# Patient Record
Sex: Female | Born: 1980 | Race: White | Hispanic: No | Marital: Married | Smoking: Never smoker
Health system: Southern US, Community
[De-identification: ages and names within clinical notes are randomized; demographics above are authoritative.]

---

## 2007-12-05 ENCOUNTER — Other Ambulatory Visit: Admission: RE | Admit: 2007-12-05 | Discharge: 2007-12-05 | Payer: Self-pay | Admitting: Gynecology

## 2013-04-25 LAB — OB RESULTS CONSOLE ANTIBODY SCREEN: Antibody Screen: NEGATIVE

## 2013-04-25 LAB — OB RESULTS CONSOLE RPR: RPR: NONREACTIVE

## 2013-04-25 LAB — OB RESULTS CONSOLE HIV ANTIBODY (ROUTINE TESTING): HIV: NONREACTIVE

## 2013-04-25 LAB — OB RESULTS CONSOLE HEPATITIS B SURFACE ANTIGEN: Hepatitis B Surface Ag: NEGATIVE

## 2013-04-25 LAB — OB RESULTS CONSOLE RUBELLA ANTIBODY, IGM: Rubella: IMMUNE

## 2013-04-25 LAB — OB RESULTS CONSOLE ABO/RH: RH TYPE: POSITIVE

## 2013-11-07 LAB — OB RESULTS CONSOLE GBS: STREP GROUP B AG: NEGATIVE

## 2013-11-14 ENCOUNTER — Encounter (HOSPITAL_COMMUNITY): Payer: Self-pay | Admitting: Pharmacist

## 2013-11-23 ENCOUNTER — Encounter (HOSPITAL_COMMUNITY): Payer: Self-pay

## 2013-11-23 NOTE — H&P (Signed)
Erica Taylor is a 33 y.o. female presenting for primary cesarean section for breech presentation.  Declined external cephalic version. Prenatal course otherwise uncomplicated Maternal Medical History:  Reason for admission: Primary cesarean section  Fetal activity: Perceived fetal activity is normal.    Prenatal complications: Persistent breech presentation  Prenatal Complications - Diabetes: none.    OB History   Grav Para Term Preterm Abortions TAB SAB Ect Mult Living   1              No past medical history on file. No past surgical history on file. Family History: family history is not on file. Social History:  has no tobacco, alcohol, and drug history on file.   Prenatal Transfer Tool  Maternal Diabetes: No Genetic Screening: Declined Maternal Ultrasounds/Referrals: Normal Fetal Ultrasounds or other Referrals:  None Maternal Substance Abuse:  No Significant Maternal Medications:  None Significant Maternal Lab Results:  None Other Comments:  None  ROS    There were no vitals taken for this visit. Maternal Exam:  Abdomen: Fetal presentation: breech     Physical Exam  Constitutional: She is oriented to person, place, and time. She appears well-developed and well-nourished.  HENT:  Head: Normocephalic.  Mouth/Throat: Oropharynx is clear and moist.  Eyes: Conjunctivae and EOM are normal. Pupils are equal, round, and reactive to light.  Cardiovascular: Normal rate, regular rhythm and normal heart sounds.   Respiratory: Effort normal and breath sounds normal.  GI: Soft. Bowel sounds are normal.  Gravid uterus consistent with dates.   Genitourinary:  Cervix not checked  Musculoskeletal: Normal range of motion.  Neurological: She is alert and oriented to person, place, and time.    Prenatal labs: ABO, Rh: A/Positive/-- (07/15 1014) Antibody: Negative (07/15 1014) Rubella: Immune (07/15 1014) RPR: Nonreactive (07/15 1014)  HBsAg: Negative (07/15 1014)   HIV: Non-reactive (07/15 1014)  GBS:     Assessment/Plan: Intrauterine pregnancy at 39 weeks with breech presentation desires primary cesarean section Risk of cesarean section discussed.  These include:  Risk of infection;  Risk of hemorrhage that could require transfusions with the associated risk of aids or hepatitis;  Excessive bleeding could require hysterectomy;  Risk of injury to adjacent organs including bladder, bowel or ureters;  Risk of DVT's and possible pulmonary embolus.  Patient expresses a understanding of indications and risks.;Risk of cesarean section discussed.  These include:  Risk of infection;  Risk of hemorrhage that could require transfusions with the associated risk of aids or hepatitis;  Excessive bleeding could require hysterectomy;  Risk of injury to adjacent organs including bladder, bowel or ureters;  Risk of DVT's and possible pulmonary embolus.  Patient expresses a understanding of indications and risks.;  Larraine Argo S 11/23/2013, 4:57 PM

## 2013-11-24 NOTE — Patient Instructions (Signed)
Your procedure is scheduled on: Wednesday, Feb. 18, 2015  [facility] at: 7:00am  Pick up the phone at the desk and dial [phone].  Call this number if you have problems the morning of surgery: [phone].  Remember: Do NOT eat food: AFTER MIDNIGHT TUESDAY Do NOT drink clear liquids after: AFTER MIDNIGHT TUESDAY Take these medicines the morning of surgery with a SIP OF WATER: NONE  Do NOT wear jewelry (body piercing), make-up, or nail polish. Do NOT wear lotions, powders, or perfumes.  You may wear deoderant. Do NOT shave for 48 hours prior to surgery. Do NOT bring valuables to the hospital. Contacts, dentures, or bridgework may not be worn into surgery. Leave suitcase in car.  After surgery it may be brought to your room.  For patients admitted to the hospital, checkout time is 11:00 AM the day of discharge.

## 2013-11-27 ENCOUNTER — Encounter (HOSPITAL_COMMUNITY)
Admission: AD | Disposition: A | Discharge status: Home / Self Care | Payer: Self-pay | Source: Ambulatory Visit | Attending: Obstetrics & Gynecology

## 2013-11-27 ENCOUNTER — Encounter (HOSPITAL_COMMUNITY): Payer: Self-pay

## 2013-11-27 ENCOUNTER — Inpatient Hospital Stay (HOSPITAL_COMMUNITY)
Admission: AD | Admit: 2013-11-27 | Discharge: 2013-11-30 | DRG: 766 | Disposition: A | Discharge status: Home / Self Care | Payer: BC Managed Care – PPO | Source: Ambulatory Visit | Attending: Obstetrics & Gynecology | Admitting: Obstetrics & Gynecology

## 2013-11-27 ENCOUNTER — Inpatient Hospital Stay (HOSPITAL_COMMUNITY)
Admission: RE | Admit: 2013-11-27 | Discharge: 2013-11-27 | Disposition: A | Discharge status: Home / Self Care | Payer: BC Managed Care – PPO | Source: Ambulatory Visit

## 2013-11-27 ENCOUNTER — Encounter (HOSPITAL_COMMUNITY): Payer: BC Managed Care – PPO | Admitting: Anesthesiology

## 2013-11-27 ENCOUNTER — Inpatient Hospital Stay (HOSPITAL_COMMUNITY): Payer: BC Managed Care – PPO | Admitting: Anesthesiology

## 2013-11-27 DIAGNOSIS — D649 Anemia, unspecified: Secondary | ICD-10-CM | POA: Diagnosis present

## 2013-11-27 DIAGNOSIS — O321XX0 Maternal care for breech presentation, not applicable or unspecified: Secondary | ICD-10-CM | POA: Diagnosis present

## 2013-11-27 DIAGNOSIS — O9902 Anemia complicating childbirth: Secondary | ICD-10-CM | POA: Diagnosis present

## 2013-11-27 DIAGNOSIS — O4290 Premature rupture of membranes, unspecified as to length of time between rupture and onset of labor, unspecified weeks of gestation: Principal | ICD-10-CM | POA: Diagnosis present

## 2013-11-27 DIAGNOSIS — Z98891 History of uterine scar from previous surgery: Secondary | ICD-10-CM

## 2013-11-27 LAB — CBC
HCT: 32.6 % — ABNORMAL LOW (ref 36.0–46.0)
Hemoglobin: 10.9 g/dL — ABNORMAL LOW (ref 12.0–15.0)
MCH: 29.1 pg (ref 26.0–34.0)
MCHC: 33.4 g/dL (ref 30.0–36.0)
MCV: 86.9 fL (ref 78.0–100.0)
Platelets: 262 10*3/uL (ref 150–400)
RBC: 3.75 MIL/uL — ABNORMAL LOW (ref 3.87–5.11)
RDW: 13 % (ref 11.5–15.5)
WBC: 11.4 10*3/uL — AB (ref 4.0–10.5)

## 2013-11-27 LAB — TYPE AND SCREEN
ABO/RH(D): A POS
Antibody Screen: NEGATIVE

## 2013-11-27 LAB — ABO/RH: ABO/RH(D): A POS

## 2013-11-27 LAB — RPR: RPR Ser Ql: NONREACTIVE

## 2013-11-27 SURGERY — Surgical Case
Anesthesia: Spinal | Site: Abdomen

## 2013-11-27 MED ORDER — SODIUM CHLORIDE 0.9 % IJ SOLN
3.0000 mL | INTRAMUSCULAR | Status: DC | PRN
Start: 2013-11-27 — End: 2013-11-30

## 2013-11-27 MED ORDER — FAMOTIDINE IN NACL 20-0.9 MG/50ML-% IV SOLN
20.0000 mg | Freq: Once | INTRAVENOUS | Status: AC
  Administered 2013-11-27: 20 mg via INTRAVENOUS
  Filled 2013-11-27: qty 50

## 2013-11-27 MED ORDER — NALOXONE HCL 1 MG/ML IJ SOLN
1.0000 ug/kg/h | INTRAVENOUS | Status: DC | PRN
  Filled 2013-11-27: qty 2

## 2013-11-27 MED ORDER — ONDANSETRON HCL 4 MG/2ML IJ SOLN: 4.0000 mg | Freq: Three times a day (TID) | INTRAMUSCULAR | Status: DC | PRN

## 2013-11-27 MED ORDER — DIBUCAINE 1 % RE OINT: 1.0000 "application " | TOPICAL_OINTMENT | RECTAL | Status: DC | PRN

## 2013-11-27 MED ORDER — NALBUPHINE HCL 10 MG/ML IJ SOLN: 5.0000 mg | INTRAMUSCULAR | Status: DC | PRN

## 2013-11-27 MED ORDER — WITCH HAZEL-GLYCERIN EX PADS: 1.0000 "application " | MEDICATED_PAD | CUTANEOUS | Status: DC | PRN

## 2013-11-27 MED ORDER — FENTANYL CITRATE 0.05 MG/ML IJ SOLN: 25.0000 ug | INTRAMUSCULAR | Status: DC | PRN

## 2013-11-27 MED ORDER — DIPHENHYDRAMINE HCL 50 MG/ML IJ SOLN
INTRAMUSCULAR | Status: AC
  Filled 2013-11-27: qty 1

## 2013-11-27 MED ORDER — OXYCODONE-ACETAMINOPHEN 5-325 MG PO TABS
1.0000 | ORAL_TABLET | ORAL | Status: DC | PRN
  Administered 2013-11-28 – 2013-11-30 (×8): 1 via ORAL
  Filled 2013-11-27 (×8): qty 1

## 2013-11-27 MED ORDER — LACTATED RINGERS IV BOLUS (SEPSIS): 1000.0000 mL | Freq: Once | INTRAVENOUS | Status: DC

## 2013-11-27 MED ORDER — PRENATAL MULTIVITAMIN CH
1.0000 | ORAL_TABLET | Freq: Every day | ORAL | Status: DC
  Administered 2013-11-28 – 2013-11-29 (×2): 1 via ORAL
  Filled 2013-11-27 (×2): qty 1

## 2013-11-27 MED ORDER — KETOROLAC TROMETHAMINE 60 MG/2ML IM SOLN
INTRAMUSCULAR | Status: AC
  Filled 2013-11-27: qty 2

## 2013-11-27 MED ORDER — TETANUS-DIPHTH-ACELL PERTUSSIS 5-2.5-18.5 LF-MCG/0.5 IM SUSP: 0.5000 mL | Freq: Once | INTRAMUSCULAR | Status: DC

## 2013-11-27 MED ORDER — METOCLOPRAMIDE HCL 5 MG/ML IJ SOLN: 10.0000 mg | Freq: Three times a day (TID) | INTRAMUSCULAR | Status: DC | PRN

## 2013-11-27 MED ORDER — CEFAZOLIN SODIUM-DEXTROSE 2-3 GM-% IV SOLR: 2.0000 g | INTRAVENOUS | Status: DC

## 2013-11-27 MED ORDER — OXYTOCIN 40 UNITS IN LACTATED RINGERS INFUSION - SIMPLE MED: 62.5000 mL/h | INTRAVENOUS | Status: AC

## 2013-11-27 MED ORDER — MEPERIDINE HCL 25 MG/ML IJ SOLN: 6.2500 mg | INTRAMUSCULAR | Status: DC | PRN

## 2013-11-27 MED ORDER — CITRIC ACID-SODIUM CITRATE 334-500 MG/5ML PO SOLN
30.0000 mL | Freq: Once | ORAL | Status: AC
  Administered 2013-11-27: 30 mL via ORAL
  Filled 2013-11-27: qty 15

## 2013-11-27 MED ORDER — SCOPOLAMINE 1 MG/3DAYS TD PT72
1.0000 | MEDICATED_PATCH | Freq: Once | TRANSDERMAL | Status: DC
  Administered 2013-11-27: 1.5 mg via TRANSDERMAL

## 2013-11-27 MED ORDER — ONDANSETRON HCL 4 MG/2ML IJ SOLN
INTRAMUSCULAR | Status: AC
  Filled 2013-11-27: qty 2

## 2013-11-27 MED ORDER — CEFAZOLIN SODIUM-DEXTROSE 2-3 GM-% IV SOLR
2.0000 g | INTRAVENOUS | Status: AC
  Administered 2013-11-27: 2 g via INTRAVENOUS
  Filled 2013-11-27: qty 50

## 2013-11-27 MED ORDER — MORPHINE SULFATE (PF) 0.5 MG/ML IJ SOLN
INTRAMUSCULAR | Status: DC | PRN
  Administered 2013-11-27: .1 mg via INTRATHECAL

## 2013-11-27 MED ORDER — LACTATED RINGERS IV SOLN
40.0000 [IU] | INTRAVENOUS | Status: DC | PRN
  Administered 2013-11-27: 40 [IU] via INTRAVENOUS

## 2013-11-27 MED ORDER — LACTATED RINGERS IV SOLN
INTRAVENOUS | Status: DC
  Administered 2013-11-28: 07:00:00 via INTRAVENOUS

## 2013-11-27 MED ORDER — SIMETHICONE 80 MG PO CHEW
80.0000 mg | CHEWABLE_TABLET | Freq: Three times a day (TID) | ORAL | Status: DC
  Administered 2013-11-28 – 2013-11-30 (×6): 80 mg via ORAL
  Filled 2013-11-27 (×6): qty 1

## 2013-11-27 MED ORDER — FENTANYL CITRATE 0.05 MG/ML IJ SOLN
INTRAMUSCULAR | Status: DC | PRN
  Administered 2013-11-27: 15 ug via INTRATHECAL

## 2013-11-27 MED ORDER — NALOXONE HCL 0.4 MG/ML IJ SOLN: 0.4000 mg | INTRAMUSCULAR | Status: DC | PRN

## 2013-11-27 MED ORDER — LACTATED RINGERS IV SOLN
INTRAVENOUS | Status: DC
  Administered 2013-11-27 (×4): via INTRAVENOUS

## 2013-11-27 MED ORDER — DIPHENHYDRAMINE HCL 25 MG PO CAPS
25.0000 mg | ORAL_CAPSULE | ORAL | Status: DC | PRN
  Administered 2013-11-28 (×3): 25 mg via ORAL
  Filled 2013-11-27 (×3): qty 1

## 2013-11-27 MED ORDER — PHENYLEPHRINE 8 MG IN D5W 100 ML (0.08MG/ML) PREMIX OPTIME
INJECTION | INTRAVENOUS | Status: DC | PRN
  Administered 2013-11-27: 60 ug/min via INTRAVENOUS

## 2013-11-27 MED ORDER — ONDANSETRON HCL 4 MG/2ML IJ SOLN: 4.0000 mg | INTRAMUSCULAR | Status: DC | PRN

## 2013-11-27 MED ORDER — SENNOSIDES-DOCUSATE SODIUM 8.6-50 MG PO TABS
2.0000 | ORAL_TABLET | ORAL | Status: DC
Start: 2013-11-28 — End: 2013-11-30
  Administered 2013-11-29 (×2): 2 via ORAL
  Filled 2013-11-27 (×2): qty 2

## 2013-11-27 MED ORDER — DEXTROSE IN LACTATED RINGERS 5 % IV SOLN: INTRAVENOUS | Status: DC

## 2013-11-27 MED ORDER — SIMETHICONE 80 MG PO CHEW: 80.0000 mg | CHEWABLE_TABLET | ORAL | Status: DC | PRN

## 2013-11-27 MED ORDER — ONDANSETRON HCL 4 MG/2ML IJ SOLN
INTRAMUSCULAR | Status: DC | PRN
  Administered 2013-11-27: 4 mg via INTRAVENOUS

## 2013-11-27 MED ORDER — KETOROLAC TROMETHAMINE 30 MG/ML IJ SOLN: 30.0000 mg | Freq: Four times a day (QID) | INTRAMUSCULAR | Status: AC | PRN

## 2013-11-27 MED ORDER — MORPHINE SULFATE 0.5 MG/ML IJ SOLN
INTRAMUSCULAR | Status: AC
  Filled 2013-11-27: qty 10

## 2013-11-27 MED ORDER — LANOLIN HYDROUS EX OINT: 1.0000 "application " | TOPICAL_OINTMENT | CUTANEOUS | Status: DC | PRN

## 2013-11-27 MED ORDER — DIPHENHYDRAMINE HCL 50 MG/ML IJ SOLN
25.0000 mg | INTRAMUSCULAR | Status: DC | PRN
Start: 2013-11-27 — End: 2013-11-30

## 2013-11-27 MED ORDER — DIPHENHYDRAMINE HCL 25 MG PO CAPS: 25.0000 mg | ORAL_CAPSULE | Freq: Four times a day (QID) | ORAL | Status: DC | PRN

## 2013-11-27 MED ORDER — PHENYLEPHRINE HCL 10 MG/ML IJ SOLN
INTRAMUSCULAR | Status: AC
  Filled 2013-11-27: qty 1

## 2013-11-27 MED ORDER — FENTANYL CITRATE 0.05 MG/ML IJ SOLN
INTRAMUSCULAR | Status: AC
  Filled 2013-11-27: qty 2

## 2013-11-27 MED ORDER — SIMETHICONE 80 MG PO CHEW
80.0000 mg | CHEWABLE_TABLET | ORAL | Status: DC
  Filled 2013-11-27 (×2): qty 1

## 2013-11-27 MED ORDER — ONDANSETRON HCL 4 MG PO TABS: 4.0000 mg | ORAL_TABLET | ORAL | Status: DC | PRN

## 2013-11-27 MED ORDER — KETOROLAC TROMETHAMINE 60 MG/2ML IM SOLN
60.0000 mg | Freq: Once | INTRAMUSCULAR | Status: AC | PRN
  Administered 2013-11-27: 60 mg via INTRAMUSCULAR

## 2013-11-27 MED ORDER — DIPHENHYDRAMINE HCL 50 MG/ML IJ SOLN
12.5000 mg | INTRAMUSCULAR | Status: DC | PRN
  Administered 2013-11-27: 12.5 mg via INTRAVENOUS

## 2013-11-27 MED ORDER — MENTHOL 3 MG MT LOZG: 1.0000 | LOZENGE | OROMUCOSAL | Status: DC | PRN

## 2013-11-27 MED ORDER — SCOPOLAMINE 1 MG/3DAYS TD PT72
MEDICATED_PATCH | TRANSDERMAL | Status: AC
  Filled 2013-11-27: qty 1

## 2013-11-27 MED ORDER — OXYTOCIN 10 UNIT/ML IJ SOLN
INTRAMUSCULAR | Status: AC
  Filled 2013-11-27: qty 4

## 2013-11-27 MED ORDER — ZOLPIDEM TARTRATE 5 MG PO TABS: 5.0000 mg | ORAL_TABLET | Freq: Every evening | ORAL | Status: DC | PRN

## 2013-11-27 MED ORDER — IBUPROFEN 600 MG PO TABS
600.0000 mg | ORAL_TABLET | Freq: Four times a day (QID) | ORAL | Status: DC
  Administered 2013-11-28 – 2013-11-30 (×9): 600 mg via ORAL
  Filled 2013-11-27 (×9): qty 1

## 2013-11-27 MED ORDER — BUPIVACAINE IN DEXTROSE 0.75-8.25 % IT SOLN
INTRATHECAL | Status: DC | PRN
  Administered 2013-11-27: 1.5 mg via INTRATHECAL

## 2013-11-27 SURGICAL SUPPLY — 31 items
CLAMP CORD UMBIL (MISCELLANEOUS) IMPLANT
CLOTH BEACON ORANGE TIMEOUT ST (SAFETY) ×3 IMPLANT
DERMABOND ADVANCED (GAUZE/BANDAGES/DRESSINGS)
DERMABOND ADVANCED .7 DNX12 (GAUZE/BANDAGES/DRESSINGS) IMPLANT
DRAPE LG THREE QUARTER DISP (DRAPES) IMPLANT
DRSG OPSITE POSTOP 4X10 (GAUZE/BANDAGES/DRESSINGS) ×3 IMPLANT
DURAPREP 26ML APPLICATOR (WOUND CARE) ×3 IMPLANT
ELECT REM PT RETURN 9FT ADLT (ELECTROSURGICAL) ×3
ELECTRODE REM PT RTRN 9FT ADLT (ELECTROSURGICAL) ×1 IMPLANT
EXTRACTOR VACUUM KIWI (MISCELLANEOUS) IMPLANT
EXTRACTOR VACUUM M CUP 4 TUBE (SUCTIONS) IMPLANT
EXTRACTOR VACUUM M CUP 4' TUBE (SUCTIONS)
GLOVE BIO SURGEON STRL SZ 6 (GLOVE) ×3 IMPLANT
GLOVE BIOGEL PI IND STRL 6 (GLOVE) ×2 IMPLANT
GLOVE BIOGEL PI INDICATOR 6 (GLOVE) ×4
GOWN STRL REUS W/TWL LRG LVL3 (GOWN DISPOSABLE) ×6 IMPLANT
KIT ABG SYR 3ML LUER SLIP (SYRINGE) ×3 IMPLANT
NEEDLE HYPO 25X5/8 SAFETYGLIDE (NEEDLE) ×3 IMPLANT
NS IRRIG 1000ML POUR BTL (IV SOLUTION) ×3 IMPLANT
PACK C SECTION WH (CUSTOM PROCEDURE TRAY) ×3 IMPLANT
PAD OB MATERNITY 4.3X12.25 (PERSONAL CARE ITEMS) ×3 IMPLANT
STAPLER VISISTAT 35W (STAPLE) IMPLANT
SUT CHROMIC 0 CTX 36 (SUTURE) ×9 IMPLANT
SUT MON AB 2-0 CT1 27 (SUTURE) ×3 IMPLANT
SUT PDS AB 0 CT1 27 (SUTURE) IMPLANT
SUT PLAIN 0 NONE (SUTURE) IMPLANT
SUT VIC AB 0 CT1 36 (SUTURE) ×3 IMPLANT
SUT VIC AB 4-0 KS 27 (SUTURE) ×3 IMPLANT
TOWEL OR 17X24 6PK STRL BLUE (TOWEL DISPOSABLE) ×3 IMPLANT
TRAY FOLEY CATH 14FR (SET/KITS/TRAYS/PACK) ×3 IMPLANT
WATER STERILE IRR 1000ML POUR (IV SOLUTION) ×3 IMPLANT

## 2013-11-27 NOTE — MAU Note (Signed)
Pt sent from srom and breech, scheduled for c/s at 0600

## 2013-11-27 NOTE — MAU Note (Signed)
Patient presents from MD office for SROM, awaiting C/S for breech presentation.

## 2013-11-27 NOTE — Transfer of Care (Signed)
Immediate Anesthesia Transfer of Care Note  Patient: Erica Taylor  Procedure(s) Performed: Procedure(s): CESAREAN SECTION (N/A)  Patient Location: PACU  Anesthesia Type:Spinal  Level of Consciousness: awake and alert   Airway & Oxygen Therapy: Patient Spontanous Breathing  Post-op Assessment: Report given to PACU RN  Post vital signs: Reviewed and stable  Complications: No apparent anesthesia complications

## 2013-11-27 NOTE — Anesthesia Procedure Notes (Signed)
Spinal  Patient location during procedure: OR Start time: 11/27/2013 7:33 PM Staffing Performed by: anesthesiologist  Preanesthetic Checklist Completed: patient identified, site marked, surgical consent, pre-op evaluation, timeout performed, IV checked, risks and benefits discussed and monitors and equipment checked Spinal Block Patient position: sitting Prep: site prepped and draped and DuraPrep Patient monitoring: heart rate, cardiac monitor, continuous pulse ox and blood pressure Approach: midline Location: L3-4 Injection technique: single-shot Needle Needle type: Sprotte and Pencan  Needle gauge: 24 G Needle length: 10 cm Assessment Sensory level: T4 Additional Notes Clear free flow CSF on first pass.  No paresthesia.  Patient tolerated procedure well with no apparent complications.  Jasmine DecemberA. Cassidy, MD

## 2013-11-27 NOTE — Op Note (Signed)
Erica Taylor PROCEDURE DATE: 11/27/2013  PREOPERATIVE DIAGNOSIS: Intrauterine pregnancy at  5958w3d weeks gestation, premature rupture of membranes and breech presentation  POSTOPERATIVE DIAGNOSIS: The same  PROCEDURE:   Primary Low Transverse Cesarean Section  SURGEON:  Dr. Mitchel HonourMegan Ethel Veronica  INDICATIONS: Erica Taylor is a 33 y.o. G1P0 at 9358w3d scheduled for cesarean section secondary to breech presentation with premature rupture of membranes.  The risks of cesarean section discussed with the patient included but were not limited to: bleeding which may require transfusion or reoperation; infection which may require antibiotics; injury to bowel, bladder, ureters or other surrounding organs; injury to the fetus; need for additional procedures including hysterectomy in the event of a life-threatening hemorrhage; placental abnormalities wth subsequent pregnancies, incisional problems, thromboembolic phenomenon and other postoperative/anesthesia complications. The patient concurred with the proposed plan, giving informed written consent for the procedure.    FINDINGS:  Viable female infant in cephalic presentation, APGARs 4,9: weight pending  clear amniotic fluid.  Intact placenta, three vessel cord.  Grossly normal uterus, ovaries and fallopian tubes. .   ANESTHESIA:    Epidural ESTIMATED BLOOD LOSS: 600 ml SPECIMENS: Placenta sent to L&D COMPLICATIONS: None immediate  PROCEDURE IN DETAIL:  The patient received intravenous antibiotics and had sequential compression devices applied to her lower extremities while in the preoperative area.  She was then taken to the operating room where spinal anesthesia was administered and was found to be adequate. She was then placed in a dorsal supine position with a leftward tilt, and prepped and draped in a sterile manner.  A foley catheter was placed into her bladder and attached to constant gravity.  After an adequate timeout was performed, a Pfannenstiel skin  incision was made with scalpel and carried through to the underlying layer of fascia. The fascia was incised in the midline and this incision was extended bilaterally using the Mayo scissors. Kocher clamps were applied to the superior aspect of the fascial incision and the underlying rectus muscles were dissected off bluntly. A similar process was carried out on the inferior aspect of the facial incision. The rectus muscles were separated in the midline bluntly and the peritoneum was entered bluntly.   A bladder flap was created sharply and developed bluntly.  The bladder was protected behind the bladder blade.  A transverse hysterotomy was made with a scalpel and extended bilaterally bluntly. The bladder blade was then removed. The infant was successfully delivered using breech maneuvers, and cord was clamped and cut and infant was handed over to awaiting neonatology team. Uterine massage was then administered and the placenta delivered intact with three-vessel cord. The uterus was cleared of clot and debris.  The hysterotomy was closed with #1 Chromic.  A second imbricating suture of #1 Chromic was used to reinforce the incision and aid in hemostasis.  The peritoneum and rectus muscles were noted to be hemostatic.  The fascia was closed with 0-Vicryl in a running fashion with good restoration of anatomy.  The subcutaneus tissue was copiously irrigated.  The skin was closed with 2-0 Vicryl in a subcuticular fashion.  Pt tolerated the procedure will.  All counts were correct x2.  Pt went to the recovery room in stable condition.

## 2013-11-27 NOTE — Anesthesia Preprocedure Evaluation (Signed)
Anesthesia Evaluation  Patient identified by MRN, date of birth, ID band Patient awake    Reviewed: Allergy & Precautions, H&P , NPO status , Patient's Chart, lab work & pertinent test results  History of Anesthesia Complications Negative for: history of anesthetic complications  Airway Mallampati: II TM Distance: >3 FB Neck ROM: Full    Dental   Pulmonary neg pulmonary ROS,    Pulmonary exam normal       Cardiovascular negative cardio ROS  Rhythm:Regular Rate:Normal     Neuro/Psych negative neurological ROS  negative psych ROS   GI/Hepatic negative GI ROS, Neg liver ROS,   Endo/Other  negative endocrine ROS  Renal/GU negative Renal ROS     Musculoskeletal negative musculoskeletal ROS (+)   Abdominal   Peds  Hematology  (+) anemia ,   Anesthesia Other Findings   Reproductive/Obstetrics (+) Pregnancy                           Anesthesia Physical Anesthesia Plan  ASA: II  Anesthesia Plan: Spinal   Post-op Pain Management:    Induction:   Airway Management Planned: Natural Airway  Additional Equipment: None  Intra-op Plan:   Post-operative Plan:   Informed Consent: I have reviewed the patients History and Physical, chart, labs and discussed the procedure including the risks, benefits and alternatives for the proposed anesthesia with the patient or authorized representative who has indicated his/her understanding and acceptance.   Dental advisory given  Plan Discussed with: CRNA and Surgeon  Anesthesia Plan Comments:         Anesthesia Quick Evaluation

## 2013-11-27 NOTE — H&P (Signed)
  Please see previously dictated H&P by Dr. Arelia SneddonMcComb.  Update:  Patient presented to office today for evaluation for ROM.  She reports leakage starting at 730am.  She had mild ctx to follow but no VB.  +FM.  No other antepartum complications.  Last po at 10am.    VSS.    Gen: A&O x 3 Abd: soft, NT Pelvic: SSE-positive pooling and nitrazine; cvx visually closed.  Ext: no c/c/e  Bedside u/s confirms breech presentation  Patient is again counseled for primary C/S secondary to breech with PROM.  All questions were answered.  32yo G1 at 7364w3d for primary C/S secondary to breech presentation -Labs -Await po status

## 2013-11-28 ENCOUNTER — Encounter (HOSPITAL_COMMUNITY): Payer: Self-pay | Admitting: Obstetrics & Gynecology

## 2013-11-28 LAB — CBC
HEMATOCRIT: 28.2 % — AB (ref 36.0–46.0)
Hemoglobin: 9.5 g/dL — ABNORMAL LOW (ref 12.0–15.0)
MCH: 29.2 pg (ref 26.0–34.0)
MCHC: 33.7 g/dL (ref 30.0–36.0)
MCV: 86.8 fL (ref 78.0–100.0)
Platelets: 219 10*3/uL (ref 150–400)
RBC: 3.25 MIL/uL — ABNORMAL LOW (ref 3.87–5.11)
RDW: 13.1 % (ref 11.5–15.5)
WBC: 14.3 10*3/uL — ABNORMAL HIGH (ref 4.0–10.5)

## 2013-11-28 NOTE — Addendum Note (Signed)
Addendum created 11/28/13 [id number] by Renford DillsJanet L Zuriel Yeaman, CRNA   Modules edited: Notes Section   Notes Section:  File: [id number]

## 2013-11-28 NOTE — Progress Notes (Signed)
Subjective: Postpartum Day 1: Cesarean Delivery Patient reports tolerating PO.    Objective: Vital signs in last 24 hours: Temp:  [97.6 F (36.4 C)-98.7 F (37.1 C)] 98.7 F (37.1 C) (02/17 0600) Pulse Rate:  [64-94] 90 (02/17 0605) Resp:  [16-28] 20 (02/17 0605) BP: (85-122)/(50-79) 106/50 mmHg (02/17 0605) SpO2:  [97 %-100 %] 97 % (02/17 0605) Weight:  [186 lb 6.4 oz (84.55 kg)] 186 lb 6.4 oz (84.55 kg) (02/16 1330)  Physical Exam:  General: alert and cooperative Lochia: appropriate Uterine Fundus: firm Incision: scant drainage noted on bandage DVT Evaluation: No evidence of DVT seen on physical exam. Negative Homan's sign. No cords or calf tenderness.   Recent Labs  11/27/13 1405 11/28/13 0550  HGB 10.9* 9.5*  HCT 32.6* 28.2*  Foley with clear urine , adequate output  Assessment/Plan: Status post Cesarean section. Doing well postoperatively.  Continue current care.  Alee Gressman G 11/28/2013, 9:25 AM

## 2013-11-28 NOTE — Anesthesia Postprocedure Evaluation (Signed)
  Anesthesia Post-op Note  Anesthesia Post Note  Patient: Erica Taylor  Procedure(s) Performed: Procedure(s) (LRB): CESAREAN SECTION (N/A)  Anesthesia type: Spinal  Patient location: PACU  Post pain: Pain level controlled  Post assessment: Post-op Vital signs reviewed  Post vital signs: Reviewed  Level of consciousness: awake  Complications: No apparent anesthesia complications

## 2013-11-28 NOTE — Anesthesia Postprocedure Evaluation (Signed)
  Anesthesia Post-op Note  Patient: Erica Taylor  Procedure(s) Performed: Procedure(s): CESAREAN SECTION (N/A)  Patient Location: Mother/Baby  Anesthesia Type:Spinal  Level of Consciousness: awake  Airway and Oxygen Therapy: Patient Spontanous Breathing  Post-op Pain: mild  Post-op Assessment: Patient's Cardiovascular Status Stable and Respiratory Function Stable  Post-op Vital Signs: stable  Complications: No apparent anesthesia complications

## 2013-11-29 ENCOUNTER — Inpatient Hospital Stay (HOSPITAL_COMMUNITY)
Admission: AD | Admit: 2013-11-29 | Payer: BC Managed Care – PPO | Source: Ambulatory Visit | Admitting: Obstetrics and Gynecology

## 2013-11-29 SURGERY — Surgical Case
Anesthesia: Regional

## 2013-11-29 NOTE — Progress Notes (Signed)
Subjective: Postpartum Day 2: Cesarean Delivery Patient reports tolerating PO, + flatus and no problems voiding.    Objective: Vital signs in last 24 hours: Temp:  [97.6 F (36.4 C)-98.4 F (36.9 C)] 98 F (36.7 C) (02/18 0626) Pulse Rate:  [71-81] 81 (02/18 0626) Resp:  [17-18] 17 (02/18 0626) BP: (91-105)/(49-68) 91/49 mmHg (02/18 0626) SpO2:  [98 %-99 %] 98 % (02/18 0626)  Physical Exam:  General: alert and cooperative Lochia: appropriate Uterine Fundus: firm Incision: old drainage noted on bandage DVT Evaluation: No evidence of DVT seen on physical exam. Negative Homan's sign. No cords or calf tenderness. No significant calf/ankle edema.   Recent Labs  11/27/13 1405 11/28/13 0550  HGB 10.9* 9.5*  HCT 32.6* 28.2*    Assessment/Plan: Status post Cesarean section. Doing well postoperatively.  Continue current care.  CURTIS,CAROL G 11/29/2013, 9:25 AM

## 2013-11-30 MED ORDER — IBUPROFEN 600 MG PO TABS: 600.0000 mg | ORAL_TABLET | Freq: Four times a day (QID) | ORAL | Status: AC

## 2013-11-30 MED ORDER — OXYCODONE-ACETAMINOPHEN 5-325 MG PO TABS: 1.0000 | ORAL_TABLET | ORAL | Status: AC | PRN

## 2013-11-30 NOTE — Lactation Note (Signed)
This note was copied from the chart of Girl Roddie Mcshley Glodowski. Lactation Consultation Note  Patient Name: Girl Roddie Mcshley Bunkley ZOXWR'UToday's Date: 11/30/2013 Reason for consult: Follow-up assessment Baby 62 hours old. Mom reports that baby had been cluster feeding and latching on well, but then refused to latch later last night. However, baby latched again this morning fine and nursed several times. Attempted to latch baby while LC in room, but baby appears completely satisfied from last feeding, and slept soundly even when offered breast dripping with colostrum. Reviewed positioning and latch with mom. Engorgement prevention/treatment reviewed. Mom aware of OP/BFSG services. Mom denies nipple/breast pain.   Maternal Data    Feeding Feeding Type: Breast Fed Length of feed: 15 min  LATCH Score/Interventions Latch: Too sleepy or reluctant, no latch achieved, no sucking elicited. (Mom reports baby fed a little earlier.) Intervention(s): Skin to skin  Audible Swallowing: Spontaneous and intermittent Intervention(s): Skin to skin  Type of Nipple: Everted at rest and after stimulation  Comfort (Breast/Nipple): Filling, red/small blisters or bruises, mild/mod discomfort     Hold (Positioning): No assistance needed to correctly position infant at breast.  LATCH Score: 8  Lactation Tools Discussed/Used     Consult Status Consult Status: Complete    Nancy NordmannWILLIARD, Tyffany Waldrop 11/30/2013, 10:09 AM

## 2013-11-30 NOTE — Discharge Summary (Signed)
Obstetric Discharge Summary Reason for Admission: cesarean section and rupture of membranes Prenatal Procedures: ultrasound Intrapartum Procedures: cesarean: low cervical, transverse Postpartum Procedures: none Complications-Operative and Postpartum: none Hemoglobin  Date Value Ref Range Status  11/28/2013 9.5* 12.0 - 15.0 g/dL Final     HCT  Date Value Ref Range Status  11/28/2013 28.2* 36.0 - 46.0 % Final    Physical Exam:  General: alert and cooperative Lochia: appropriate Uterine Fundus: firm Incision: honeycomb dressing noted with small old drainage noted on bandage DVT Evaluation: No evidence of DVT seen on physical exam. Negative Homan's sign. No cords or calf tenderness. No significant calf/ankle edema.  Discharge Diagnoses: Term Pregnancy-delivered  Discharge Information: Date: 11/30/2013 Activity: pelvic rest Diet: routine Medications: PNV, Ibuprofen and Percocet Condition: stable Instructions: refer to practice specific booklet Discharge to: home   Newborn Data: Live born female  Birth Weight: 5 lb 14.7 oz (2685 g) APGAR: 4, 10  Home with mother.  Andyn Sales G 11/30/2013, 8:37 AM

## 2013-12-06 ENCOUNTER — Encounter (HOSPITAL_COMMUNITY): Payer: Self-pay | Admitting: *Deleted

## 2014-01-17 ENCOUNTER — Ambulatory Visit (HOSPITAL_COMMUNITY)
Admission: RE | Admit: 2014-01-17 | Discharge: 2014-01-17 | Disposition: A | Discharge status: Home / Self Care | Payer: BC Managed Care – PPO | Source: Ambulatory Visit | Attending: Obstetrics & Gynecology | Admitting: Obstetrics & Gynecology

## 2014-01-17 NOTE — Lactation Note (Signed)
Adult Lactation Consultation Outpatient Visit Note  Patient Name: Erica Taylor                      Baby  Girl  [name], DOB [birth year], now 7 wks old Date of Birth: [birth year]                                 Birth weight 5 lb. 14.7 oz Gestational Age at Delivery: Unknown Type of Delivery: C/S  Breastfeeding History: Frequency of Breastfeeding: every 2 hours, cluster feeds evening and at night Length of Feeding: 30-40- minutes off and on, falls asleep easily at the breast Voids: 6+ per day Stools: 3 per day, was mustard in color, now more yellow/green  Supplementing / Method: Pumping:  Type of Pump:  Medela pump n style   Frequency:  2 times/day, 1 time in the am, 1 TIME IN PM  Volume:  2-3 oz from 1 breast while BF on other breast, in the evening 1 oz from 1 breast while BF on other breast  Comments: Mom is here for feeding assessment, concerned about milk supply. She is returning to work 1st of May. Baby does not seem satisfied at the breast, cluster feeding in the evening. Mom reports she had yeast on her breast and baby had thrush in March for 1 month, but this has resolved. Mom reports she receives about 2-3 oz with pumping in the am, but 1 oz with pumping in the pm. If pumping after BF receives very little milk. She has none for storage when returning to work. She was pumping and dumping during the time she had yeast, but during this time stopped pumping except for 2 times per day. Mom pumps one breast while BF from the other currently and she supplements in the evening before bed with 2 1/2 - 3 oz of EBM at this 1 feeding and baby seems more satisfied. Mom reports she started Fenugreek today to increase her milk volume.    Consultation Evaluation:  Initial Feeding Assessment: Pre-feed Weight:  7 lb. 7.8 oz/3396 gm Post-feed Weight:  7 lb. 8.7 oz/3422 gm Amount Transferred:  26 ml from right breast Comments:  Mom initially latched baby in cradle hold, LC adjusted position for baby to  obtain more depth with latch. Baby becomes sleepy at the breast after the initial 10 minutes. Lots of non-nutritive suckling noted at the end of the feeding. Baby nursed a total of 16 minutes.  Additional Feeding Assessment: Pre-feed Weight:   7 lb. 8.7 oz/3422 gm Post-feed Weight:  7 lb. 9.3 oz/3440 gm Amount Transferred:18 ml Comments:  With nursing for 11 minutes on left breast in football hold. Again becoming sleepy at the breast, some non-nutritive suckling noted.   Additional Feeding Assessment: Pre-feed Weight:   7 lb. 9.3 oz/3440 gm Post-feed Weight:   7 lb. 9.7 oz/3450 gm Amount Transferred:  10 ml Comments: re-latched baby to left breast while pumping right breast and he transferred an additional 10 ml of breast milk. Had Mom post pump both breasts approx 20-25 minutes and she received 16 ml of breast milk.   Total Breast milk Transferred this Visit:  70 ml of breast milk, 54 ml at breast, 16 ml via bottle with slow flow nipple Total Supplement Given: 30 ml of Enfamil 20 cal formula.   Additional Interventions: Advised Mom LC feels baby is not getting enough at the  breast which is why she is fussy and cluster feeding in the evening, but is satisfied when she supplements with 2-3 oz of EBM before bedtime. Advised we need to work on milk supply. Recommended to continue Fenugreek, but advised Mom she is going to need to pump more frequently to increase milk production. Plan discussed with Mom: BF whenever baby is hungry but at least every 3 hours.  Pre-pump when able to get past 1st milk ejection - this will help baby get higher fat content milk at breast. BF keeping baby actively nursing for 15-20 minutes, both breasts each feeding. BF on 1st breast, then when switching to 2nd breast, pump the 1st breast while baby nurses on 2nd breast, so baby may get more milk and will help with milk flow/emptying breast. After feeding post pump 2nd breast for 15 minutes. Alternate this pattern  between breasts each feeding. Other option:  BF both breasts - then post pump both breasts for 15-20 minutes. Power pump 1 time in am/pm. Pump at least 4-6 times in 24 hours.  Supplement each feeding 40-45 ml of EBM or formula till milk supply increases. Between breast and bottle would like baby to have a minimum of 100 ml each feeding. Can increase supplements as needed.  Continue Fenugreek.   Follow-Up  Lactation OP f/u Thursday, 01/25/14 at 1:00 pm.  Support group, prn    Kearney Hard Allayna Erlich 01/17/2014, 1:47 PM

## 2014-01-25 ENCOUNTER — Ambulatory Visit (HOSPITAL_COMMUNITY)
Admission: RE | Admit: 2014-01-25 | Discharge: 2014-01-25 | Disposition: A | Discharge status: Home / Self Care | Payer: BC Managed Care – PPO | Source: Ambulatory Visit | Attending: Obstetrics and Gynecology | Admitting: Obstetrics and Gynecology

## 2014-01-25 NOTE — Lactation Note (Addendum)
Adult Lactation Consultation Outpatient Visit Note  Patient Name: Erica Taylor                                          "[name]" Date of Birth: December 17, 1980                                                     Weight today: 8-3.6,3730 Gestational Age at Delivery: Unknown                              Gain of 8 ounces in 8 days Type of Delivery:   Breastfeeding History: Frequency of Breastfeeding: every 2-3 hours Length of Feeding: 40 mins Voids: 10 Stools: 2 yellow brown seedy  Supplementing / Method: bottle feeds with 30-60 ml of EBM/ formula , during the day every 2-3 hours. At 11 p,m [name] takes a bottle with 4 ounces Pumping:  Type of Pump:Medela pump N Style   Frequency:4 times daily for 15-20  Volume:  2-3 1/2 ounces  Comments: Mother states that she has seen some  improvement with the additional pumping and taking Fenugreek. Mother states she has a hard time getting enough pumping sessions in daily.     Consultation Evaluation: Mother independently latches infant in cradle hold. Mother states she is still a little lazy. Encouraged mother to do good breast compression when infant begins to slow down with feeding.   Initial Feeding Assessment:infant still showing some ineffective suckling . She sustained latch for 15 mins .  Pre-feed Weight:3730 Post-feed Weight:3750 Amount Transferred:20 ml Comments:  Additional Feeding Assessment:Mother offered alternate breast for only 10 mins and infant pushed off. Pre-feed Weight:3750 Post-feed [id number]:3758 Amount Transferred:358ml  Comments:  Additional Feeding Assessment: SNS was sat up with 60 ml of Similac, infant took all but 5 ml . Pre-feed [id number]:3758 Post-feed [id number]:3824 Amount Transferred:11 ml Comments:55 ml from SNS  Total Breast milk Transferred this Visit: 39 ml Total Supplement Given: 55 ml from SNS  Additional Interventions:  Recommend that mother continue to cue base feed and at least every 2-3 hours Advised mother  to use SNS at least 2 times daily and more as desired Give infant 60 ml with each feeding When bottle feeding use a wide based slow flow nipple and offer infant 3 or more ounces  Recommend  That mother post pump at least 5-6 times daily for 20 mins Stressed needed for hand expression and breast massage, also discussed Power Pumping Mother to continue to take Fenugreek Nap and drink to thirst   Follow-Up  April 23 at 1 p,m.    Xcel EnergySherry McCoy [name] 01/25/2014, 1:08 PM

## 2014-02-01 ENCOUNTER — Ambulatory Visit (HOSPITAL_COMMUNITY)
Admission: RE | Admit: 2014-02-01 | Discharge: 2014-02-01 | Disposition: A | Discharge status: Home / Self Care | Payer: BC Managed Care – PPO | Source: Ambulatory Visit | Attending: Obstetrics and Gynecology | Admitting: Obstetrics and Gynecology

## 2014-02-01 NOTE — Lactation Note (Addendum)
Adult Lactation Consultation Outpatient Visit Note  Mom feels like she has gotten into a rhythm with BF, pumping and bottle feeding.  I asked her to pump once overnight to help increase her MS.  She may try "more milk plus".  Recommended switch nursing to help with breast drainage. Also it was noted that Erica Taylor does not pull gloved finger deeply into her mouth.  She has a sensitive gag reflex and also does not maintain suction.  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  She is able to extend her tongue well out of her mouth and lateralization is good. The lift of her tongue is not to the roof of her mouth.  She tires quickly at the breasts, chews and feedings can last up to an hour.  This makes me suspect that she is having movement restrictions.  Mom may do some of her own research on tongue-tie.  I also gave mom a Special Needs Feeder to encourage suckling.    Patient Name: Erica Taylor Date of Birth: June 02, 1981 Gestational Age at Delivery: Unknown Type of Delivery:   Weight 8+11.5 up 2 ounces from Tuesday!  Breastfeeding History: Frequency of Breastfeeding:  Length of Feeding: 45-60 Voids: 6+ Stools: 3+  Supplementing / Method:    First bottle is at 2:30 pm and it is about 2 ounces.  She gets 3- 4 bottles more after this.  Gave mom a Haberman feeder to try and encourage suckling and strengthen her muscles.  Pumping:  Type of Pump:Pump in Style   Frequency:pumping 4 times for 15-20 ml plus power pump  Volume:  5-6 ounces per day.  Comments:  Erica Taylor is satisfied a the breast until about 2:30 and then she requires post-feed supplements.    Consultation Evaluation:  Initial Feeding Assessment: Pre-feed [id number]:[id number] Post-feed [id number]:3976 Amount Transferred:22 Comments:Fed on the right breast for about 7 minutes and started to pull on the breast.  Took her off and weighed her  Additional Feeding Assessment: Pre-feed  [id number]:3976 Post-feed [id number]:3988 Amount Transferred:12 Comments:Started non-nutritive sucks she was removed from the left breast.  Additional Feeding Assessment: Pre-feed [id number]:3988 Post-feed Weight:4008 Amount Transferred:18 Comments: Repositioned on the right breast while mom pumped to elicit another let-down.  Baby initially was satisfied but quickly got hungry and required a formula supplement of 2 ounces.  Total Breast milk Transferred this Visit: about 50 ml Total Supplement Given: 2 oz  Additional Interventions: Taught paced bottle feeding.  Gave mom Haberman and taught use of to encourage suckling.   Follow-Up  Mom wants to attend support group,     Erica Taylor 02/01/2014, 1:07 PM

## 2014-08-13 ENCOUNTER — Encounter (HOSPITAL_COMMUNITY): Payer: Self-pay | Admitting: *Deleted

## 2020-07-22 ENCOUNTER — Other Ambulatory Visit: Payer: Self-pay | Admitting: Obstetrics and Gynecology

## 2020-07-22 DIAGNOSIS — R928 Other abnormal and inconclusive findings on diagnostic imaging of breast: Secondary | ICD-10-CM

## 2020-07-26 ENCOUNTER — Ambulatory Visit
Admission: RE | Admit: 2020-07-26 | Discharge: 2020-07-26 | Disposition: A | Discharge status: Home / Self Care | Payer: BLUE CROSS/BLUE SHIELD | Source: Ambulatory Visit | Attending: Obstetrics and Gynecology | Admitting: Obstetrics and Gynecology

## 2020-07-26 ENCOUNTER — Other Ambulatory Visit: Payer: Self-pay

## 2020-07-26 ENCOUNTER — Other Ambulatory Visit: Payer: Self-pay | Admitting: Obstetrics and Gynecology

## 2020-07-26 DIAGNOSIS — R928 Other abnormal and inconclusive findings on diagnostic imaging of breast: Secondary | ICD-10-CM

## 2020-07-26 DIAGNOSIS — N6489 Other specified disorders of breast: Secondary | ICD-10-CM

## 2021-01-21 ENCOUNTER — Ambulatory Visit
Admission: RE | Admit: 2021-01-21 | Discharge: 2021-01-21 | Disposition: A | Discharge status: Home / Self Care | Payer: BC Managed Care – PPO | Source: Ambulatory Visit | Attending: Obstetrics and Gynecology | Admitting: Obstetrics and Gynecology

## 2021-01-21 ENCOUNTER — Other Ambulatory Visit: Payer: Self-pay | Admitting: Obstetrics and Gynecology

## 2021-01-21 ENCOUNTER — Other Ambulatory Visit: Payer: Self-pay

## 2021-01-21 DIAGNOSIS — N6489 Other specified disorders of breast: Secondary | ICD-10-CM

## 2021-07-24 ENCOUNTER — Other Ambulatory Visit: Payer: Self-pay | Admitting: Obstetrics and Gynecology

## 2021-07-24 ENCOUNTER — Other Ambulatory Visit: Payer: Self-pay

## 2021-07-24 ENCOUNTER — Ambulatory Visit
Admission: RE | Admit: 2021-07-24 | Discharge: 2021-07-24 | Disposition: A | Discharge status: Home / Self Care | Payer: BC Managed Care – PPO | Source: Ambulatory Visit | Attending: Obstetrics and Gynecology | Admitting: Obstetrics and Gynecology

## 2021-07-24 DIAGNOSIS — N6489 Other specified disorders of breast: Secondary | ICD-10-CM

## 2021-08-01 ENCOUNTER — Other Ambulatory Visit: Payer: Self-pay

## 2021-08-01 ENCOUNTER — Ambulatory Visit
Admission: RE | Admit: 2021-08-01 | Discharge: 2021-08-01 | Disposition: A | Discharge status: Home / Self Care | Payer: BC Managed Care – PPO | Source: Ambulatory Visit | Attending: Obstetrics and Gynecology | Admitting: Obstetrics and Gynecology

## 2021-08-01 DIAGNOSIS — N6489 Other specified disorders of breast: Secondary | ICD-10-CM

## 2021-09-09 IMAGING — US US BREAST*L* LIMITED INC AXILLA
1 series · 4 of 4 positions shown · non-contrast
Comparison: None.

CLINICAL DATA: Patient was recalled from screening mammogram for a
possible asymmetry in the left breast.

EXAM:
DIGITAL DIAGNOSTIC LEFT MAMMOGRAM WITH CAD AND TOMO
ULTRASOUND LEFT BREAST

[Series 1: us breast*left* limited inc axilla · 0.05mm/px · 4 of 4 slices shown]
[im 1/4]
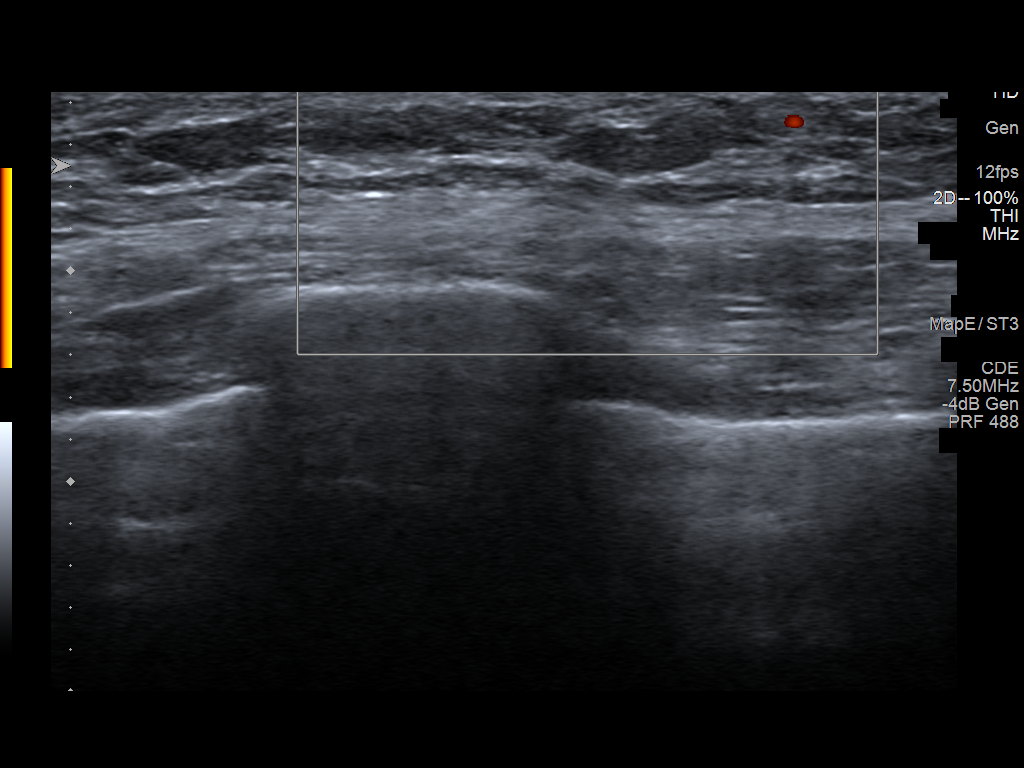
[im 2/4]
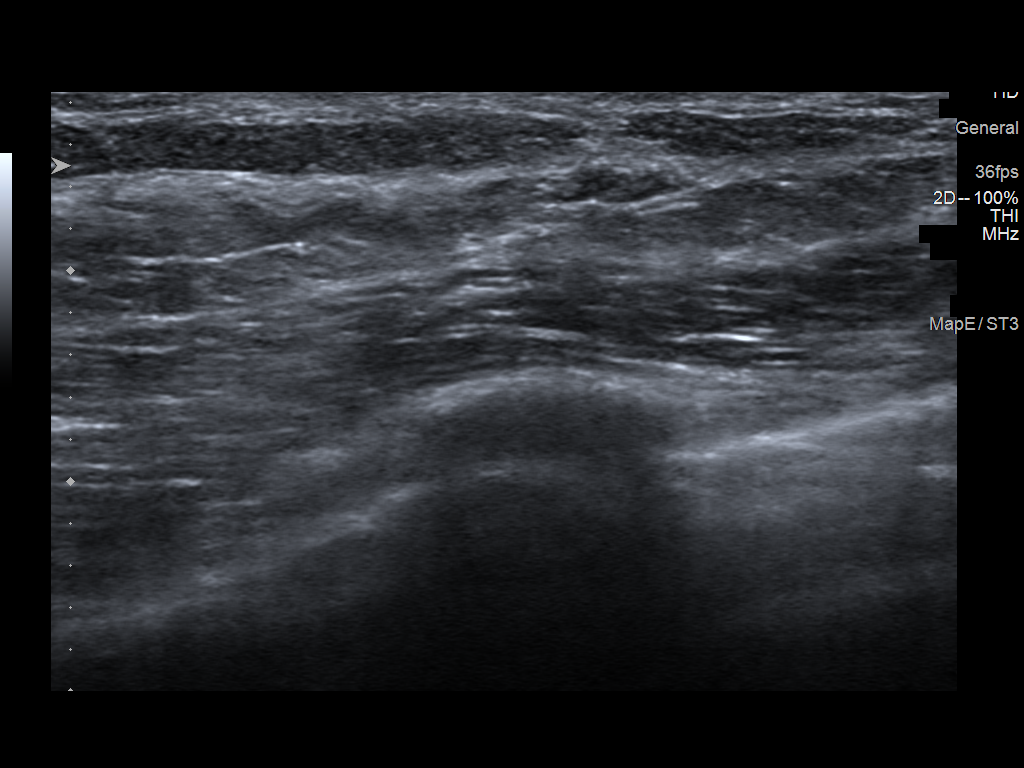
[im 3/4]
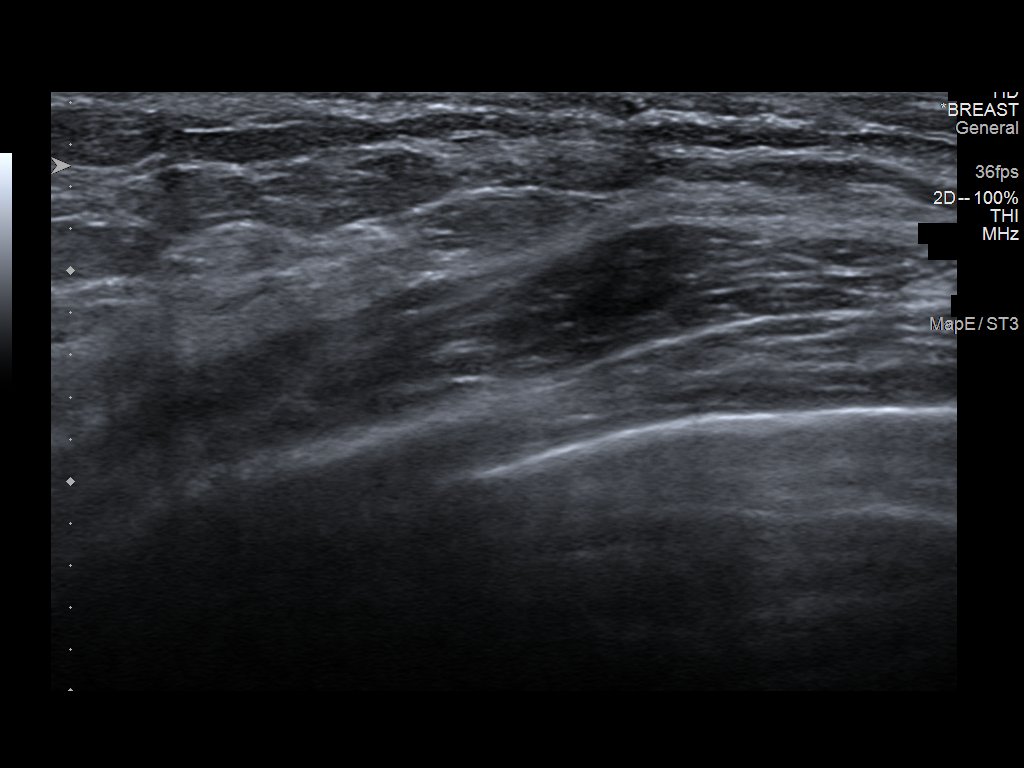
[im 4/4]
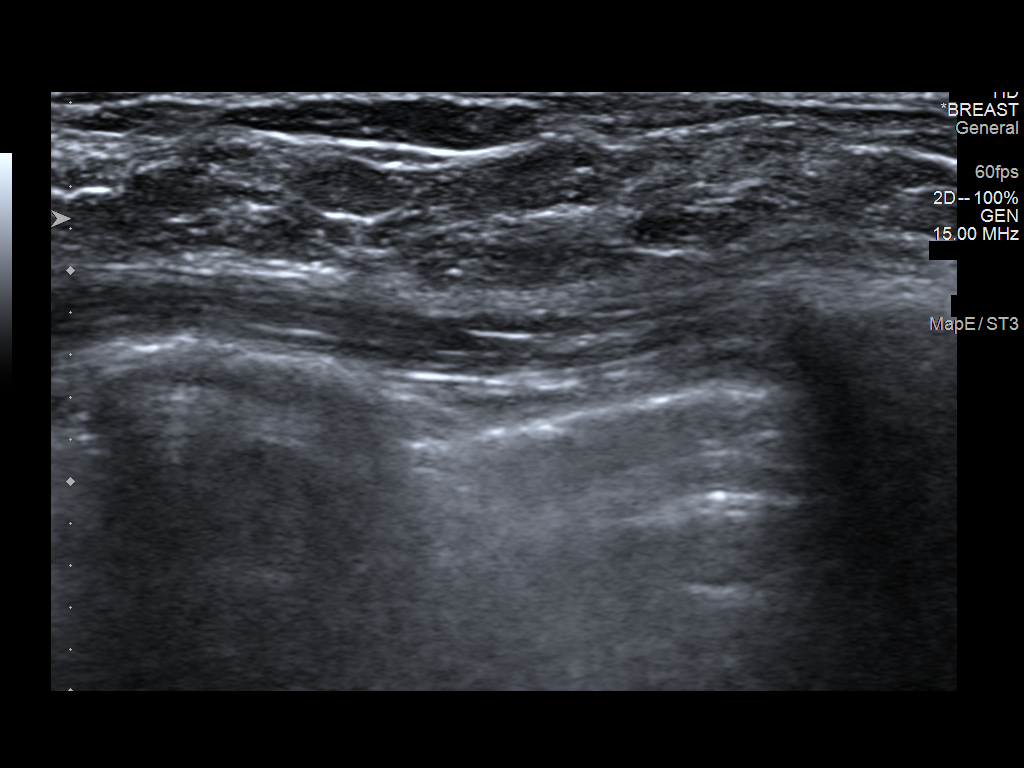

[4 of 4 positions shown; findings below may reference images not displayed]

ACR Breast Density Category c: The breast tissue is heterogeneously
dense, which may obscure small masses.
FINDINGS: Additional imaging of the left breast was performed. There is a
persistent asymmetry in lateral aspect of the breast seen on the
lateral view. No discrete mass or malignant type microcalcifications
identified.

Mammographic images were processed with CAD.

On physical exam, I do not palpate a mass in the lateral aspect of
the left breast.

Targeted ultrasound is performed, showing normal tissue in the
lateral aspect of the left breast. No solid or cystic mass, abnormal
shadowing or distortion visualized.
IMPRESSION: Probable benign asymmetric fibroglandular tissue in the left breast.

RECOMMENDATION:
Short-term interval follow-up left mammogram in 6 months is
recommended.

I have discussed the findings and recommendations with the patient.
If applicable, a reminder letter will be sent to the patient
regarding the next appointment.

BI-RADS CATEGORY  3: Probably benign.

## 2021-12-25 ENCOUNTER — Other Ambulatory Visit: Payer: Self-pay | Admitting: Obstetrics and Gynecology

## 2022-01-20 ENCOUNTER — Other Ambulatory Visit: Payer: Self-pay | Admitting: Obstetrics and Gynecology

## 2022-01-20 DIAGNOSIS — N6489 Other specified disorders of breast: Secondary | ICD-10-CM

## 2022-01-23 ENCOUNTER — Ambulatory Visit: Admission: RE | Admit: 2022-01-23 | Payer: BC Managed Care – PPO | Source: Ambulatory Visit

## 2022-01-23 ENCOUNTER — Ambulatory Visit
Admission: RE | Admit: 2022-01-23 | Discharge: 2022-01-23 | Disposition: A | Discharge status: Home / Self Care | Payer: BC Managed Care – PPO | Source: Ambulatory Visit | Attending: Obstetrics and Gynecology | Admitting: Obstetrics and Gynecology

## 2022-01-23 DIAGNOSIS — N6489 Other specified disorders of breast: Secondary | ICD-10-CM

## 2023-03-09 IMAGING — MG MM DIGITAL DIAGNOSTIC UNILAT*L* W/ TOMO W/ CAD
8 series · 8 of 24 positions shown · non-contrast
Comparison: Previous exam(s).

CLINICAL DATA: 40-year-old female presenting for six-month
follow-up after a benign, concordant left breast stereotactic
biopsy.

EXAM:
DIGITAL DIAGNOSTIC UNILATERAL LEFT MAMMOGRAM WITH TOMOSYNTHESIS AND
CAD
TECHNIQUE: Left digital diagnostic mammography and breast tomosynthesis was
performed. The images were evaluated with computer-aided detection.

[L MLO synth-2D (1 of 2)]
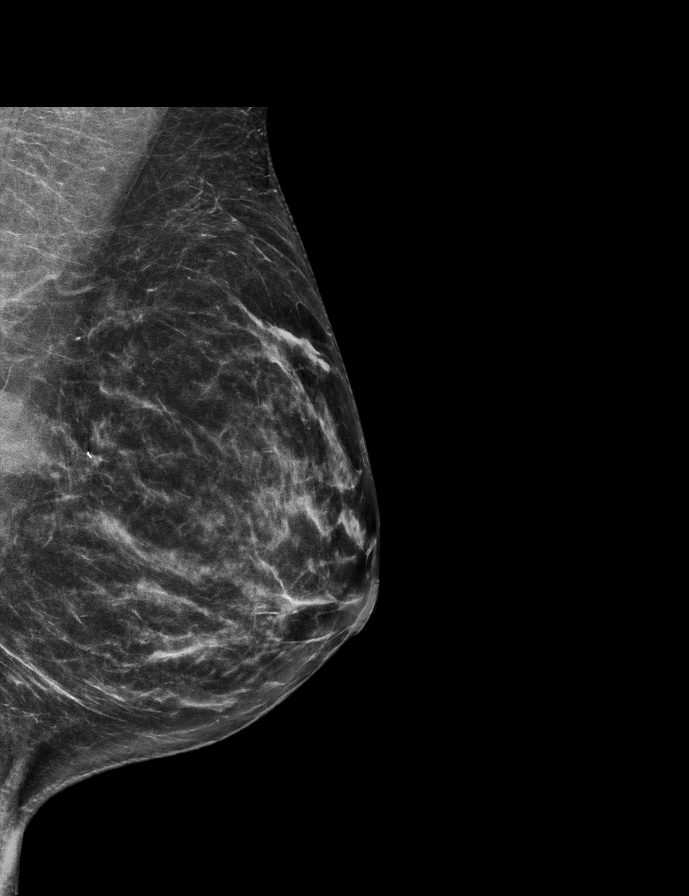

[L MLO synth-2D (2 of 2)]
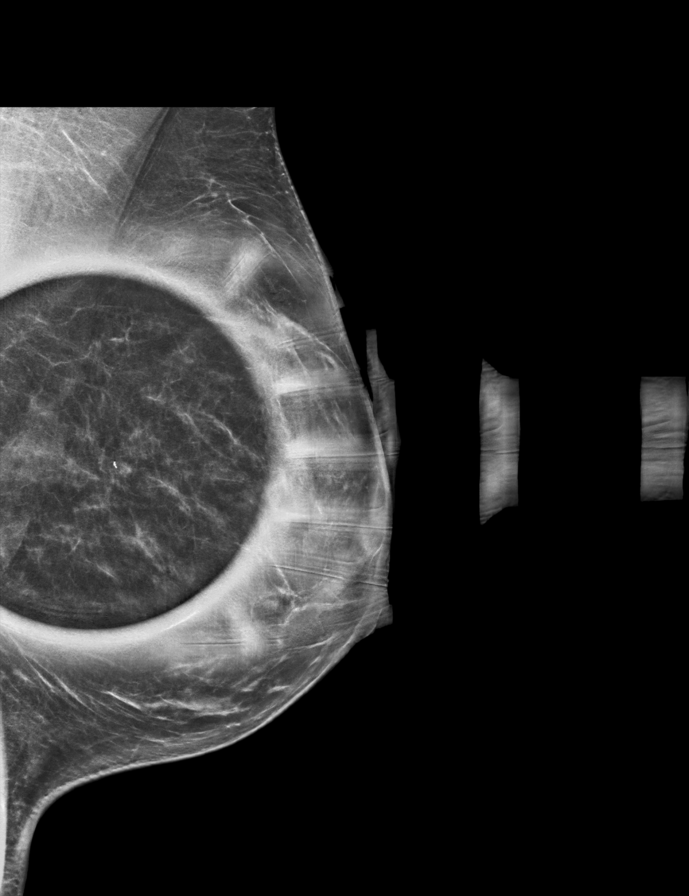

[L ML synth-2D]
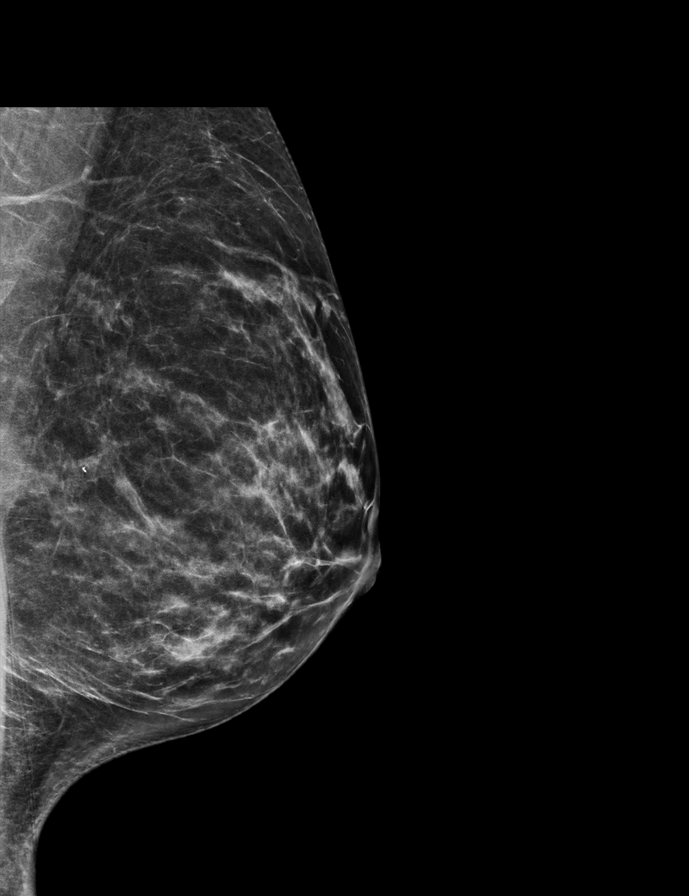

[L CC synth-2D]
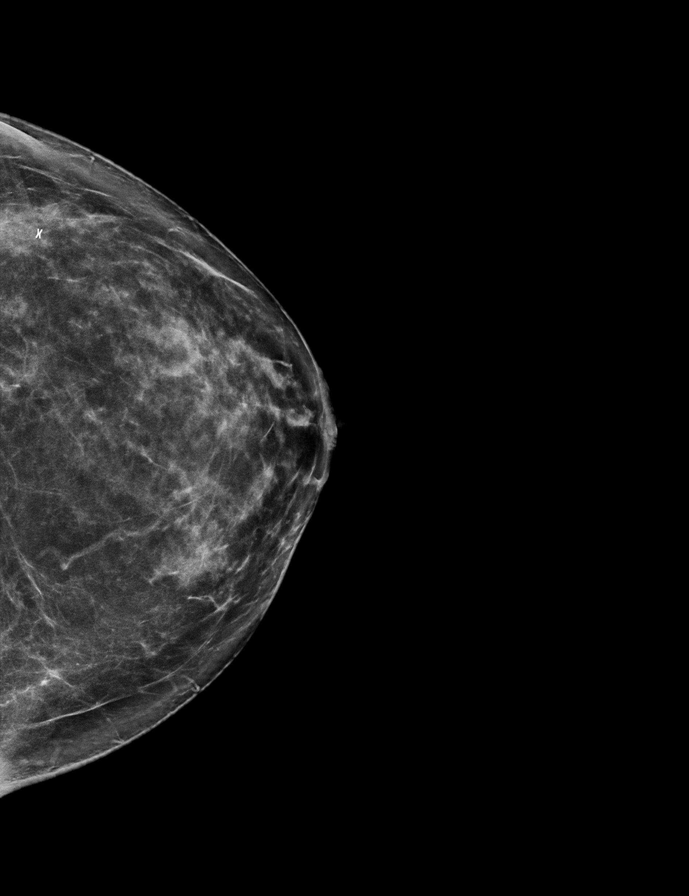

[L CC tomo · tomo slice 35/70.0]
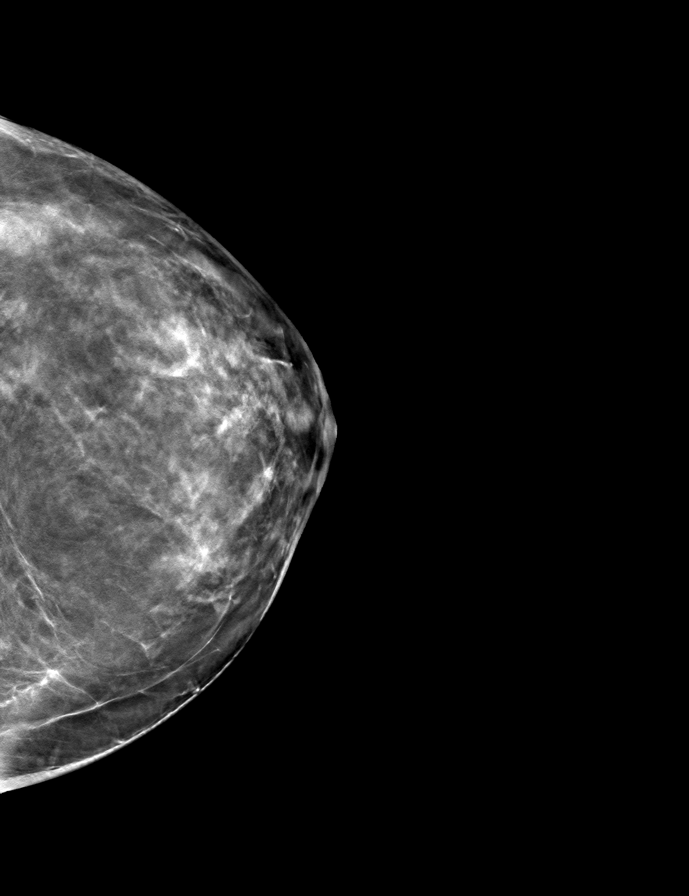

[L ML tomo · tomo slice 31/60.0]
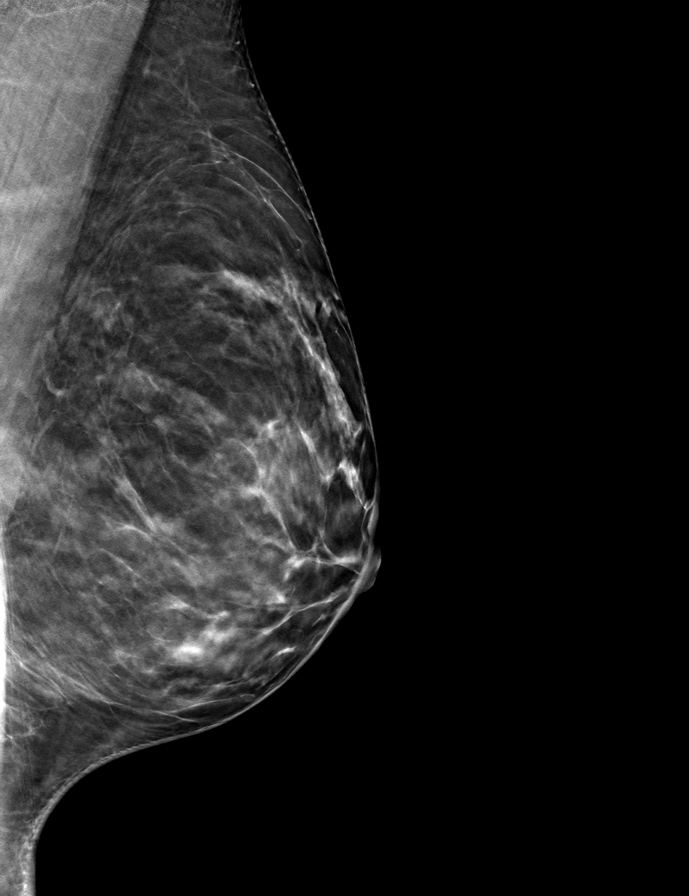

[L MLO tomo (1 of 2) · tomo slice 32/63.0]
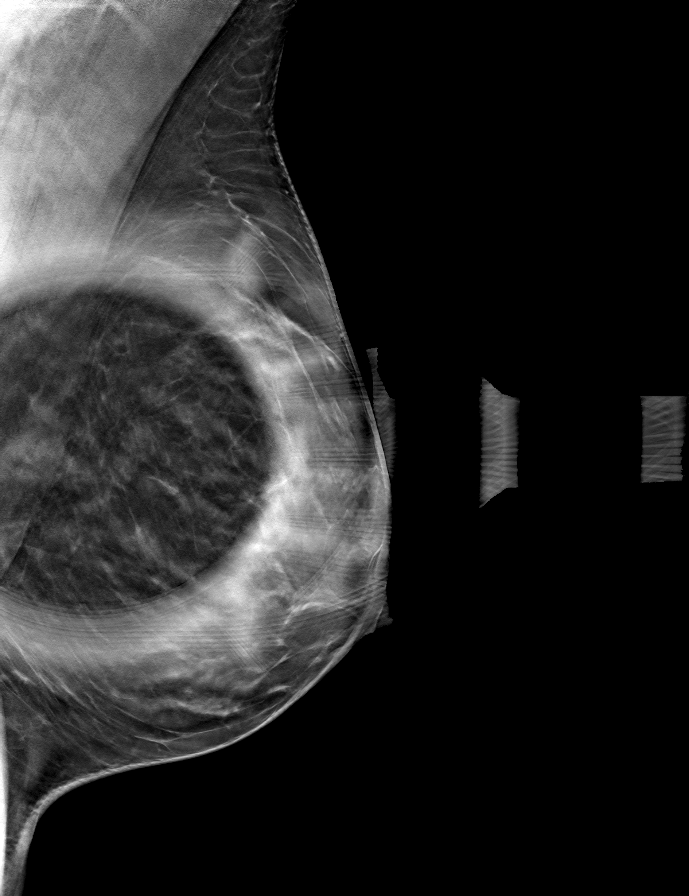

[L MLO tomo (2 of 2) · tomo slice 33/64.0]
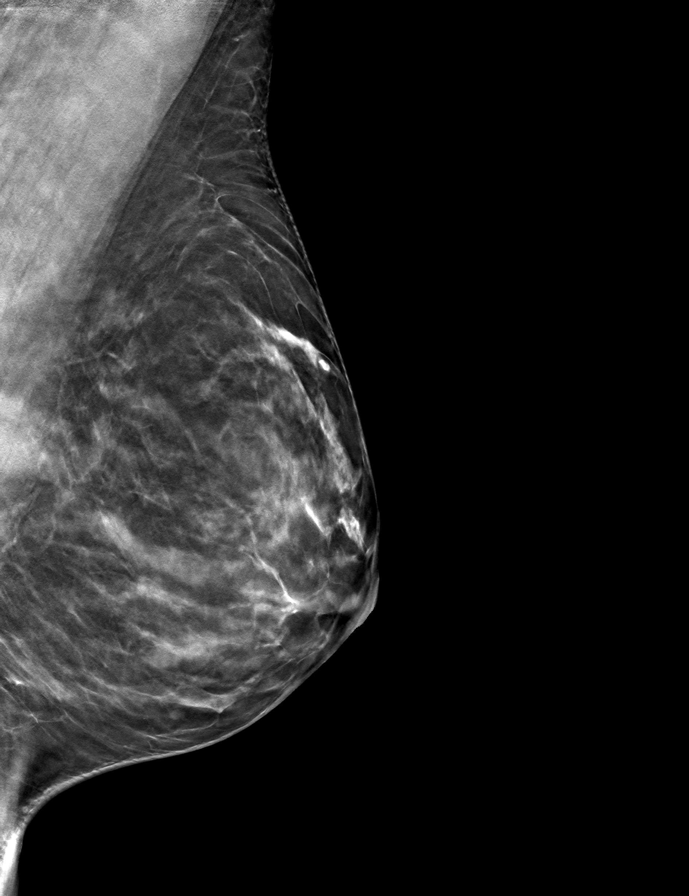

[8 of 24 positions shown; findings below may reference images not displayed]

ACR Breast Density Category c: The breast tissue is heterogeneously
dense, which may obscure small masses.
FINDINGS: A focal asymmetry is again identified in the far posterior central
left breast, best seen on the MLO projection. The asymmetry is
stable when compared to multiple prior mammograms including studies
dated back to July 2020. Post biopsy changes are noted along the
anterior margin of the asymmetry with a benign, concordant pathology
of PASH. Otherwise, no new or suspicious findings within the left
breast.
IMPRESSION: No mammographic evidence of malignancy within the left breast.
Benign, concordant post biopsy changes.

RECOMMENDATION:
The patient may return to routine annual screening, due at 41 years
old.

I have discussed the findings and recommendations with the patient.
If applicable, a reminder letter will be sent to the patient
regarding the next appointment.

BI-RADS CATEGORY  2: Benign.
